# Patient Record
Sex: Male | Born: 1940 | Race: White | Hispanic: No | Marital: Married | Smoking: Former smoker
Health system: Southern US, Community
[De-identification: ages and names within clinical notes are randomized; demographics above are authoritative.]

## PROBLEM LIST (undated history)

## (undated) DIAGNOSIS — I739 Peripheral vascular disease, unspecified: Secondary | ICD-10-CM

## (undated) DIAGNOSIS — I25810 Atherosclerosis of coronary artery bypass graft(s) without angina pectoris: Secondary | ICD-10-CM

## (undated) DIAGNOSIS — E785 Hyperlipidemia, unspecified: Secondary | ICD-10-CM

## (undated) DIAGNOSIS — E119 Type 2 diabetes mellitus without complications: Secondary | ICD-10-CM

## (undated) DIAGNOSIS — I10 Essential (primary) hypertension: Secondary | ICD-10-CM

## (undated) HISTORY — DX: Essential (primary) hypertension: I10

## (undated) HISTORY — PX: CORONARY ARTERY BYPASS GRAFT: SHX141

## (undated) HISTORY — DX: Hyperlipidemia, unspecified: E78.5

## (undated) HISTORY — DX: Peripheral vascular disease, unspecified: I73.9

## (undated) HISTORY — DX: Atherosclerosis of coronary artery bypass graft(s) without angina pectoris: I25.810

## (undated) HISTORY — PX: TOTAL HIP ARTHROPLASTY: SHX124

## (undated) HISTORY — DX: Type 2 diabetes mellitus without complications: E11.9

---

## 2004-07-15 ENCOUNTER — Ambulatory Visit: Payer: Self-pay | Admitting: Cardiovascular Disease

## 2004-07-15 ENCOUNTER — Inpatient Hospital Stay (HOSPITAL_COMMUNITY): Admission: AD | Admit: 2004-07-15 | Discharge: 2004-07-31 | Payer: Self-pay | Admitting: Cardiology

## 2004-07-24 ENCOUNTER — Ambulatory Visit: Payer: Self-pay | Admitting: Cardiovascular Disease

## 2004-08-09 ENCOUNTER — Encounter: Admission: RE | Admit: 2004-08-09 | Discharge: 2004-08-09 | Payer: Self-pay | Admitting: Cardiothoracic Surgery

## 2004-11-20 ENCOUNTER — Ambulatory Visit: Payer: Self-pay | Admitting: Cardiology

## 2005-06-09 ENCOUNTER — Ambulatory Visit: Payer: Self-pay | Admitting: Cardiology

## 2005-07-10 ENCOUNTER — Ambulatory Visit: Payer: Self-pay

## 2005-10-02 ENCOUNTER — Ambulatory Visit: Payer: Self-pay | Admitting: Cardiology

## 2006-04-28 IMAGING — CR DG CHEST 2V
2 series · 2 of 2 positions shown · non-contrast
Comparison: 07/16/2004

CLINICAL DATA: CHF, shortness of breath

CHEST - 2 VIEW:

[view not recorded (1 of 2)]
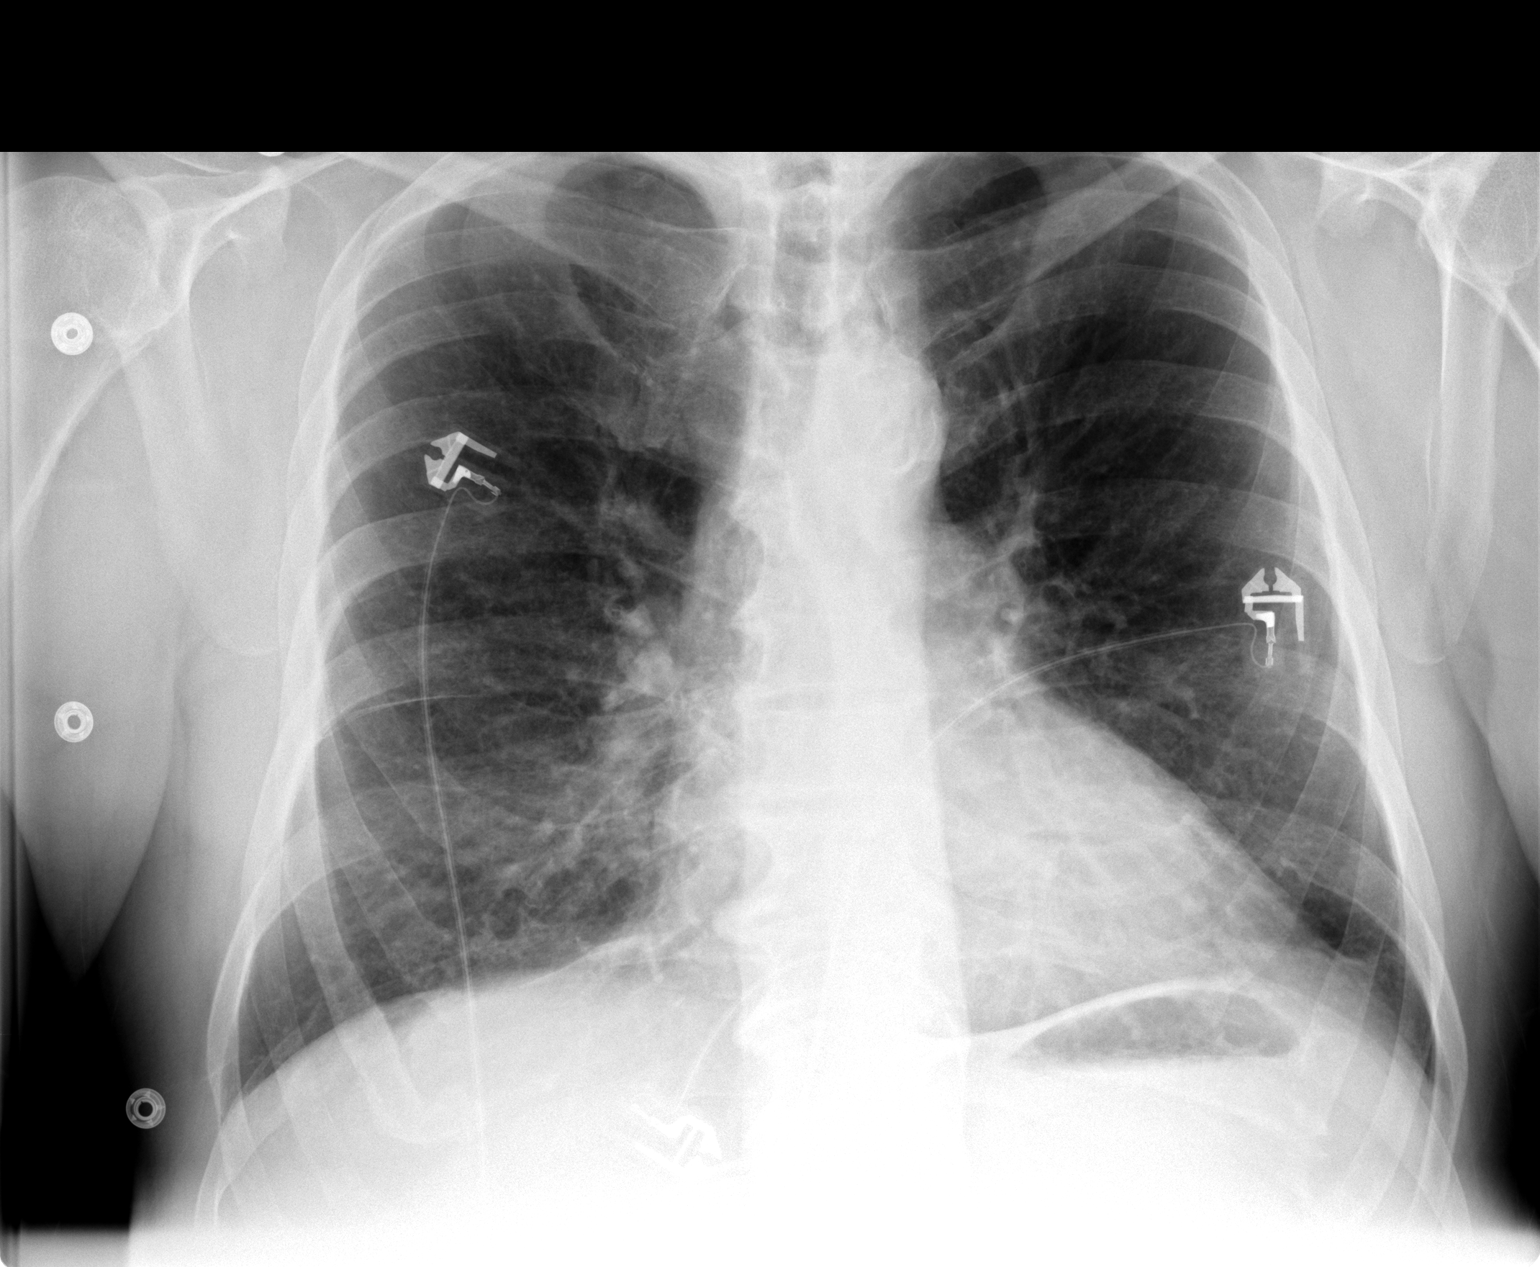

[view not recorded (2 of 2)]
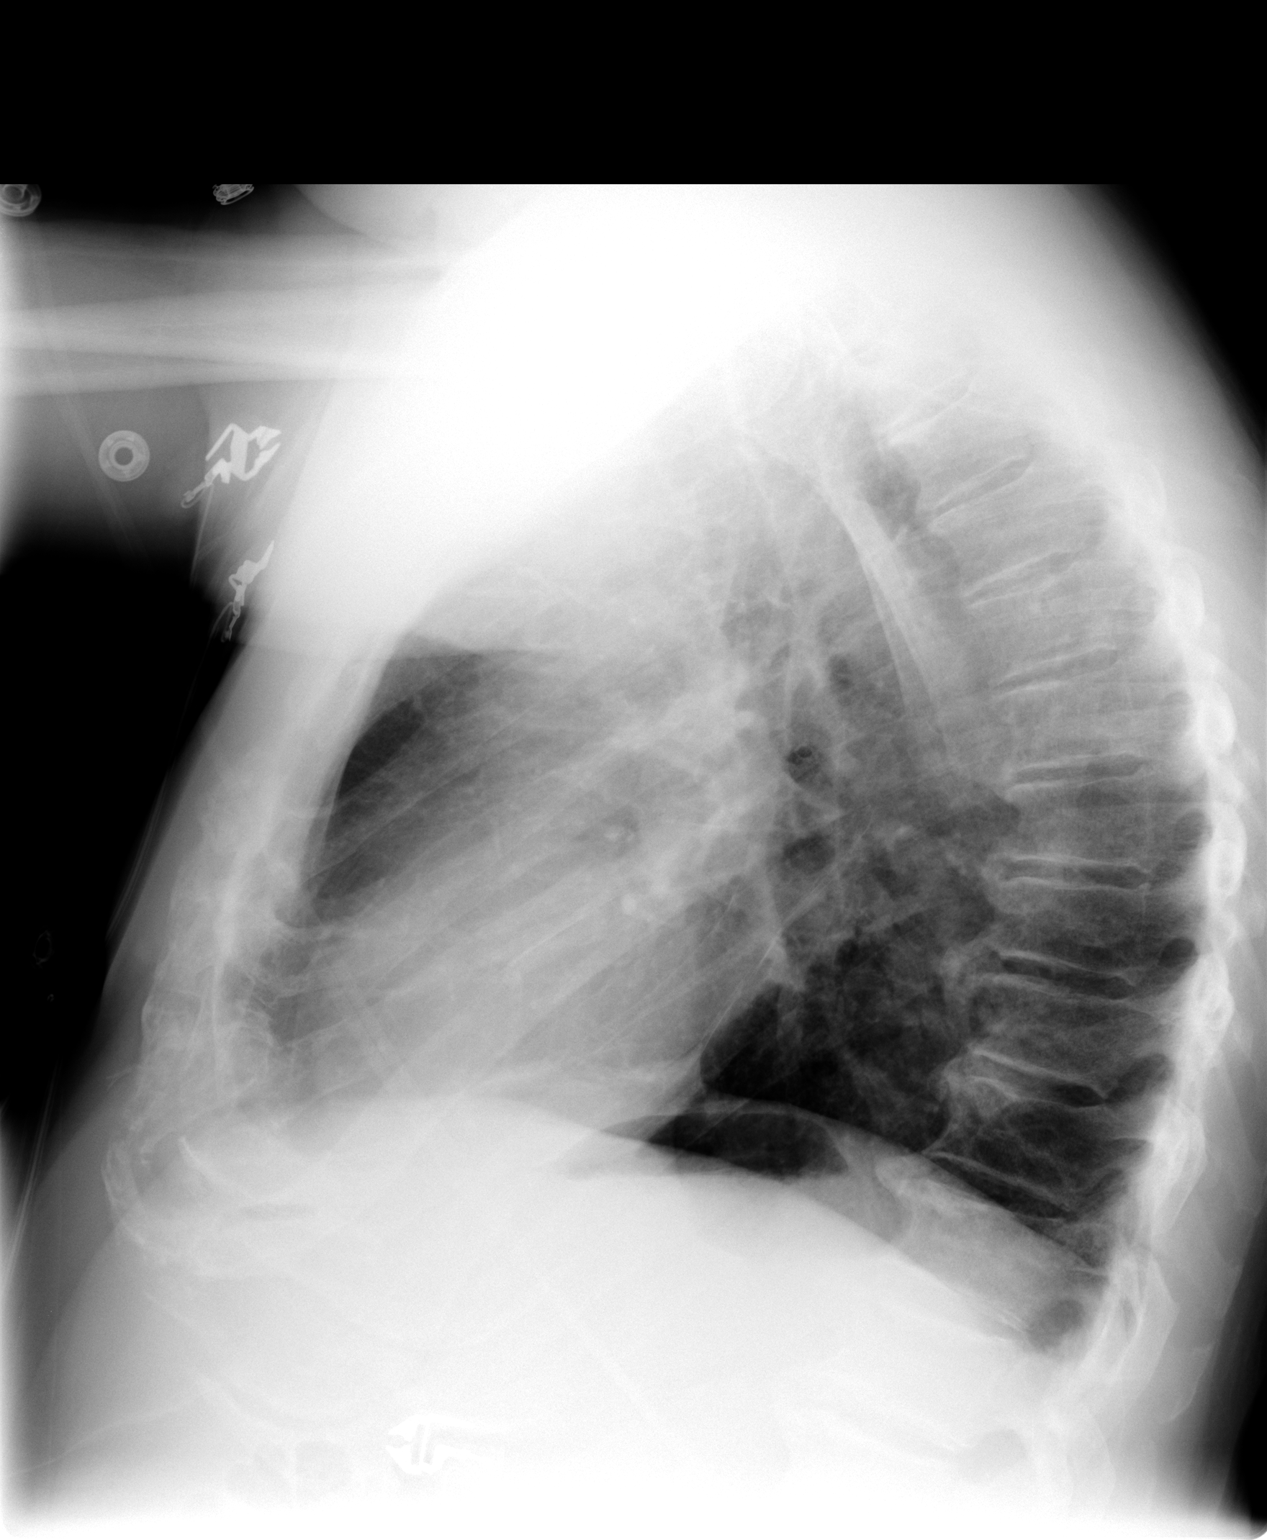

[2 of 2 positions shown; findings below may reference images not displayed]

FINDINGS: Improving CHF pattern with decreasing edema and heart size since
prior study. Mild bibasilar atelectasis. No effusions. Degenerative changes in
the thoracic spine.
IMPRESSION: Improving CHF.

Bibasilar atelectasis.

## 2006-05-03 IMAGING — CR DG CHEST 1V PORT
1 series · 1 of 1 positions shown · non-contrast
Comparison: 07/18/2004.

CLINICAL DATA: Status post CABG.
 PORTABLE SUPINE CHEST:

[view not recorded]
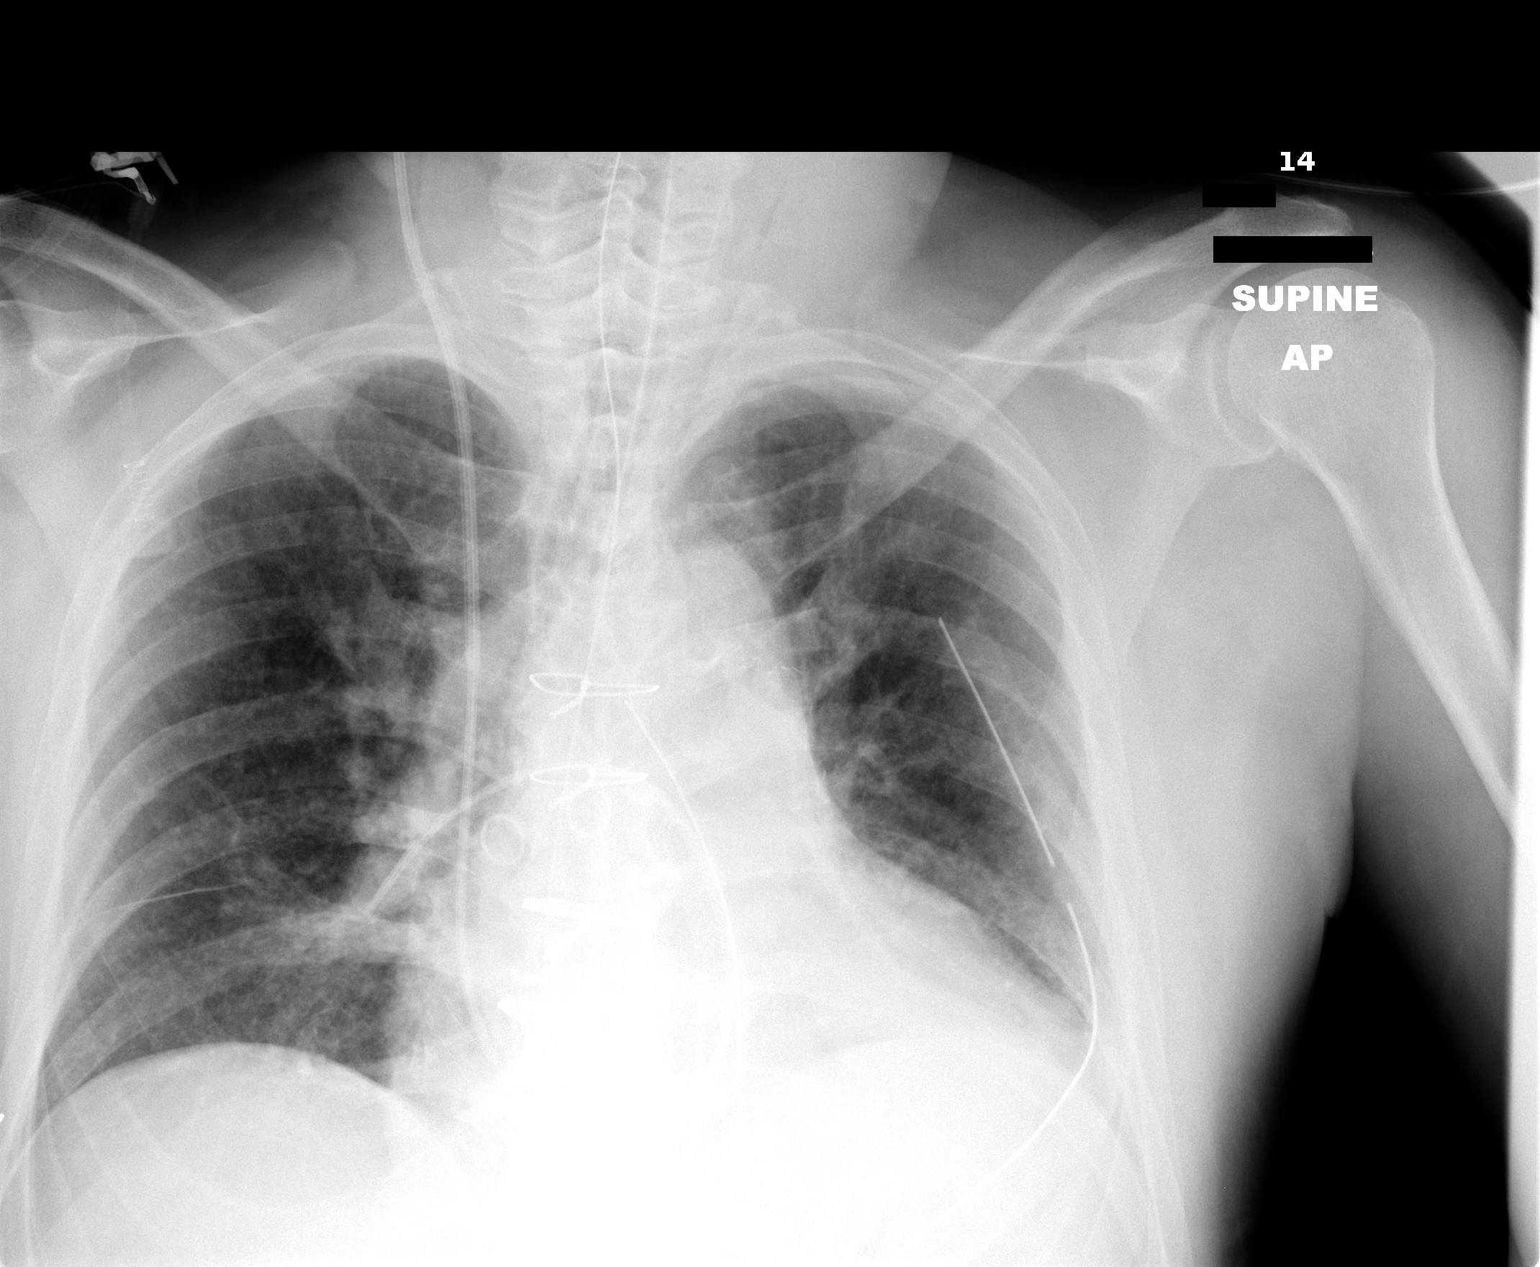

[1 of 1 positions shown; findings below may reference images not displayed]

FINDINGS: Endotracheal tube tip 3 cm above the carina at the level of the aortic knob.  Left-sided chest tubes and mediastinal drains in place without evidence of pneumothorax.  Right Swan-Ganz catheter is in place with tip in the right lower lobe pulmonary artery.  This may need to be retracted by 4.5 cm.  Cardiomegaly and pulmonary vascular prominence.  Floor has been contacted.
IMPRESSION: 1. Swan-Ganz catheter tip right lower lobe pulmonary artery.  This needs to be retracted by 4.5 cm.  
 2. Cardiomegaly and central pulmonary vascular prominence with postoperative changes as noted above.

## 2006-05-04 IMAGING — CR DG CHEST 1V PORT
1 series · 1 of 1 positions shown · non-contrast
Comparison: none

CLINICAL DATA: CHF.  CABG.
 PORTABLE CHEST, ONE VIEW ? 07/24/2004:
 Endotracheal tube and NG tube have been removed.  Mediastinal thoracostomy tubes and Swan-Ganz catheter, cardiomegaly, and evidence of CABG are stable.  Mild pulmonary vascular congestion is unchanged, as well as left basilar atelectasis.

[view not recorded]
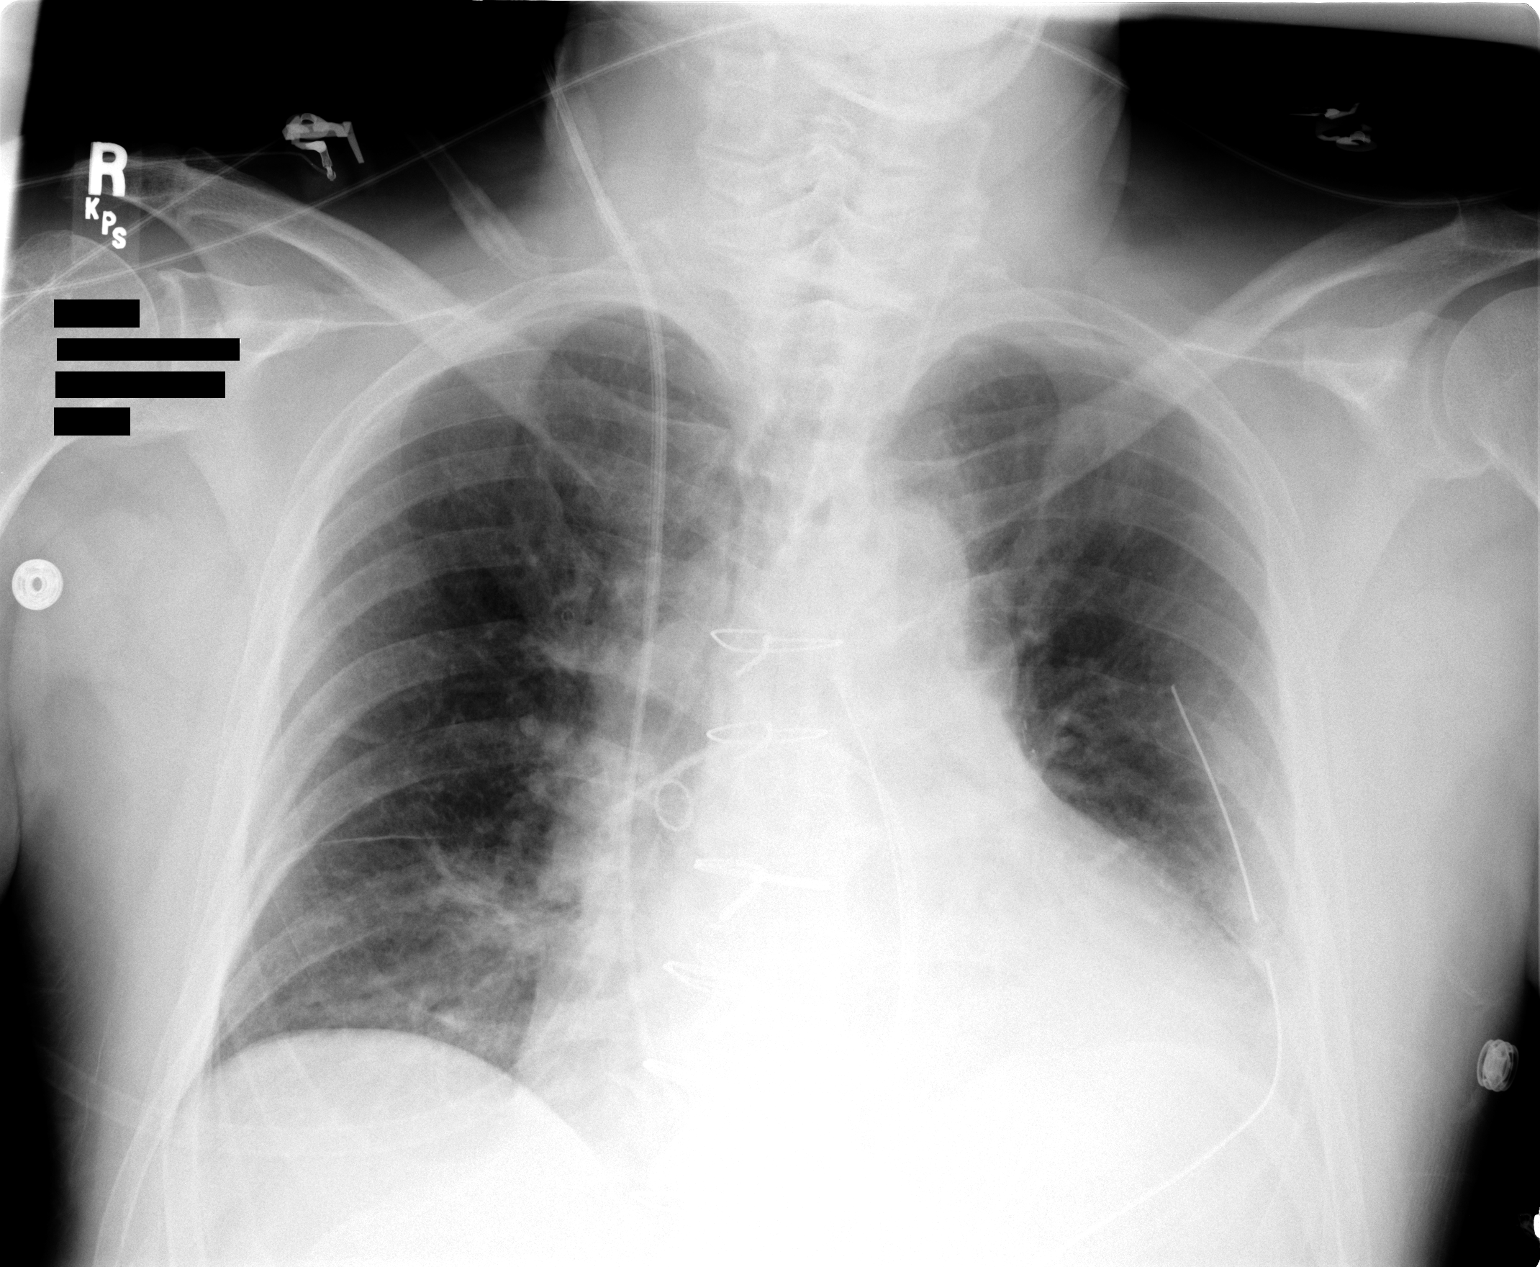

[1 of 1 positions shown; findings below may reference images not displayed]

IMPRESSION: Endotracheal and NG tube removal.  Otherwise stable chest.

## 2007-04-26 ENCOUNTER — Ambulatory Visit: Payer: Self-pay | Admitting: Cardiology

## 2007-05-04 ENCOUNTER — Ambulatory Visit: Payer: Self-pay

## 2007-05-04 ENCOUNTER — Ambulatory Visit: Payer: Self-pay | Admitting: Cardiology

## 2007-05-04 LAB — CONVERTED CEMR LAB
BUN: 14 mg/dL
CO2: 24 meq/L
Calcium: 9.3 mg/dL
Chloride: 99 meq/L
Creatinine, Ser: 1.4 mg/dL
GFR calc Af Amer: 65 mL/min
GFR calc non Af Amer: 54 mL/min
Glucose, Bld: 299 mg/dL — ABNORMAL HIGH
Potassium: 4.7 meq/L
Sodium: 132 meq/L — ABNORMAL LOW

## 2008-01-14 ENCOUNTER — Ambulatory Visit: Payer: Self-pay | Admitting: Cardiology

## 2008-02-16 ENCOUNTER — Ambulatory Visit: Payer: Self-pay | Admitting: Cardiovascular Disease

## 2008-03-06 ENCOUNTER — Encounter: Payer: Self-pay | Admitting: Cardiovascular Disease

## 2008-03-06 ENCOUNTER — Ambulatory Visit: Payer: Self-pay | Admitting: Cardiology

## 2008-03-06 LAB — CONVERTED CEMR LAB
Chloride: 98 meq/L (ref 96–112)
Creatinine, Ser: 1.77 mg/dL — ABNORMAL HIGH (ref 0.40–1.50)
Hemoglobin: 12.3 g/dL — ABNORMAL LOW (ref 13.0–17.0)
INR: 1 (ref 0.0–1.5)
MCHC: 32.9 g/dL (ref 30.0–36.0)
Potassium: 4.3 meq/L (ref 3.5–5.3)
Prothrombin Time: 13.8 s (ref 11.6–15.2)
Sodium: 131 meq/L — ABNORMAL LOW (ref 135–145)

## 2008-03-08 ENCOUNTER — Ambulatory Visit (HOSPITAL_COMMUNITY): Admission: RE | Admit: 2008-03-08 | Discharge: 2008-03-08 | Payer: Self-pay | Admitting: Cardiovascular Disease

## 2008-10-18 DIAGNOSIS — I10 Essential (primary) hypertension: Secondary | ICD-10-CM | POA: Insufficient documentation

## 2008-10-18 DIAGNOSIS — E119 Type 2 diabetes mellitus without complications: Secondary | ICD-10-CM | POA: Insufficient documentation

## 2008-10-18 DIAGNOSIS — E785 Hyperlipidemia, unspecified: Secondary | ICD-10-CM

## 2008-10-18 DIAGNOSIS — I739 Peripheral vascular disease, unspecified: Secondary | ICD-10-CM | POA: Insufficient documentation

## 2008-10-18 DIAGNOSIS — I25810 Atherosclerosis of coronary artery bypass graft(s) without angina pectoris: Secondary | ICD-10-CM

## 2009-01-23 ENCOUNTER — Ambulatory Visit: Payer: Self-pay

## 2009-03-22 ENCOUNTER — Ambulatory Visit: Payer: Self-pay | Admitting: Vascular Surgery

## 2010-06-02 ENCOUNTER — Encounter: Payer: Self-pay | Admitting: Cardiothoracic Surgery

## 2010-06-03 ENCOUNTER — Encounter: Payer: Self-pay | Admitting: Family Medicine

## 2010-06-09 LAB — CONVERTED CEMR LAB
AST: 25 units/L (ref 0–37)
Albumin: 4.1 g/dL (ref 3.5–5.2)
Alkaline Phosphatase: 26 units/L — ABNORMAL LOW (ref 39–117)
Direct LDL: 64.8 mg/dL
Total Bilirubin: 0.9 mg/dL (ref 0.3–1.2)
Total CHOL/HDL Ratio: 5.4
Triglycerides: 266 mg/dL (ref 0–149)
VLDL: 53 mg/dL — ABNORMAL HIGH (ref 0–40)

## 2010-09-24 NOTE — Assessment & Plan Note (Signed)
[facility]                            CARDIOLOGY OFFICE NOTE   KHALIL, SZCZEPANIK                         MRN:          [id number]  DATE:01/14/2008                            DOB:          [birth year]    Mr. Struss is a very pleasant 70 year old male with past medical history  of coronary artery disease, status post coronary artery bypass graft,  peripheral vascular disease, cerebrovascular disease, hypertension, and  hyperlipidemia.  Since I last saw him, he denies any dyspnea, chest  pain, palpitations, or syncope.  Note, he did have coronary artery  bypass graft in March 2006 with LIMA to the LAD, saphenous vein graft to  the diagonal, saphenous vein graft to the obtuse marginal, and saphenous  vein graft to the distal right coronary artery.  He does state that his  claudication has worsened.  He complains of bilateral lower extremity  pain with ambulation.  He can now only walk a quarter of a block without  having to stop and sit down.  It is limiting what he is able to do.  His  last ABIs were performed on May 04, 2007.  They were in the  moderate range.   His medications include Avalide 300/25 mg p.o. daily, omeprazole 40 mg  p.o. daily, fenofibrate 134 mg p.o. daily, glimepiride 4 mg p.o. daily,  clonidine 0.1 mg p.o. nightly, aspirin 325 mg p.o. daily, Zocor 20 mg  p.o. daily, Pletal 100 mg p.o. b.i.d., flaxseed oil, metformin 500 mg  p.o. b.i.d., metoprolol 25 mg p.o. b.i.d.   PHYSICAL EXAM:  VITAL SIGNS:  Today shows a blood pressure of 152/73 and  his pulse is 68.  Weight is 203 pounds.  HEENT:  Normal.  NECK:  Supple.  CHEST:  Clear.  CARDIOVASCULAR:  Regular rate and rhythm.  ABDOMEN:  No tenderness.  EXTREMITIES:  No edema.   His electrocardiogram shows a sinus rhythm at a rate of 62.  There is a  first-degree AV block.  There is inferolateral T-wave inversion.   DIAGNOSES:  1. Coronary artery disease, status post coronary  artery bypass graft -      the patient has had no chest pain or shortness of breath.  We will      continue his medical therapy to include his aspirin, beta-blocker,      angiotensin-converting enzyme inhibitor, and statin.  2. Claudication/peripheral vascular disease - his claudication appears      to be worsening.  It is now limiting what he was able to do.  He      will continue on his aspirin.  I will arrange for him to see Dr.      Myrene Galas for further evaluation.  He may need an angiogram.  3. Tobacco abuse, now resolved.  4. Hypertension - his blood pressure is elevated today.  I have added      felodipine 5 mg p.o. daily to his medical regimen.  He will track      this at home, so we can increase as needed.  I will check BMET  given his angiotensin-converting enzyme inhibitor use.  5. Cerebrovascular disease - he needs followup carotid Dopplers in      December.  6. Hyperlipidemia - he will continue on statin and Tricor and we will      check lipids and liver and adjust as indicated.  7. Diabetes mellitus.   He will see [place] back in 6 months.     Madolyn Frieze Jens Som, MD, [facility]  Electronically Signed    BSC/MedQ  DD: 01/14/2008  DT: 01/15/2008  Job #: [id number]

## 2010-09-24 NOTE — Consult Note (Signed)
NEW PATIENT CONSULTATION   Harry Hebert, Harry Hebert  DOB:  12/16/1940                                       03/22/2009  CHART#:[id number]   I saw the patient in the office today in consultation concerning his  carotid disease.  This is a pleasant 70 year old gentleman who was found  to have a carotid bruit by Dr. Samuel Germany.  This prompted a carotid duplex  scan, which showed evidence of bilateral carotid disease.  He was sent  for vascular consultation.  Of note, the patient is left-handed.  He  denies any previous history of stroke, TIAs, expressive or receptive  aphasia, or amaurosis fugax.   PAST MEDICAL HISTORY:  Significant for adult-onset diabetes which has  been stable on his current medications.  He does not require insulin.  In addition, he has hypertension, which is also well-controlled on his  current medications.  He also has hypercholesterolemia and a history of  coronary artery disease.  He underwent coronary revascularization by Dr.  Donata Clay approximately 4 years ago after a myocardial infarction.  He  denies any history of congestive heart failure or history of COPD.   In addition, the patient has a history of chronic low back pain.  He has  some mild renal insufficiency based on blood work in Dr. Martha Clan office  previously.   FAMILY HISTORY:  He is unaware of any history of premature  cardiovascular disease.   SOCIAL HISTORY:  He is married and he has 8 children.  He used to smoke  1-1/2 to 2 packs per day of cigarettes but quit 4 years ago after his  heart attack.  He does not use alcohol on a regular basis.   MEDICATIONS:  Documented on the medical history form in his chart.   REVIEW OF SYSTEMS:  VASCULAR:  He does describe some bilateral lower  extremity claudication with no history of rest pain or nonhealing  ulcers.  He has had no history of DVT or phlebitis.  NEUROLOGIC:  He has occasional headaches.  ORTHOPEDIC:  He has occasional arthritis and  joint pain.  PSYCHIATRIC:  He has had some problems with nervousness in the past.  General, cardiac, pulmonary, GI, GU, ENT, hematologic review of systems  are unremarkable and are documented on the medical history form in his  chart.   PHYSICAL EXAMINATION:  This is a pleasant 70 year old gentleman who  appears his stated age.  Blood pressure is 146/65 on the right, 140/64  on the left, heart rate is 81, temperature 97.8.  HEENT:  Extraocular  motions are intact.  Conjunctivae are normal.  There is no facial  asymmetry.  Neck is supple.  There is no JVD, and there is no cervical  lymphadenopathy.  Lungs are clear bilaterally to auscultation without  rales, rhonchi or wheezing.  Cardiovascular exam:  He has a left carotid  bruit.  Cardiac exam:  He has a regular rate and rhythm without murmur  appreciated.  He has no significant peripheral edema.  He has palpable  radial pulses and palpable femoral pulses bilaterally.  I cannot palpate  pedal pulses, although both feet are adequately perfused without  ischemic ulcers.  Abdomen is soft and nontender with normal-pitched  bowel sounds.  I cannot appreciate an aneurysm.  Musculoskeletal exam:  There are no major deformities or cyanosis.  Neurologic exam:  He has no  focal weakness or paresthesias.  Skin:  He has no rashes or ulcers.   We did obtain a carotid duplex scan in our office, which I independently  interpreted, and this shows bilateral 60%-79% carotid stenoses in the  mid range.  End-diastolic velocity on the right is only 20 cm/sec. and  on the left 59 cm/sec., so the stenoses are clearly less than 80%.  Both  vertebral arteries are patent with normally-directed flow.   I have explained we generally would not consider carotid endarterectomy  in an asymptomatic patient unless the stenosis progressed to greater  than 80% or he developed new neurologic symptoms.  I have recommend a  follow-up duplex scan in 6 months and I will see  him back at that time.  He knows to call sooner if he has any new neurologic symptoms.  In  addition, he knows to continue taking his aspirin.   Di Kindle. Edilia Bo, M.D.  Electronically Signed   CSD/MEDQ  D:  03/22/2009  T:  03/23/2009  Job:  [id number]   cc:   Renae Fickle, MD

## 2010-09-24 NOTE — Assessment & Plan Note (Signed)
[facility]                            CARDIOLOGY OFFICE NOTE   SLOANE, JUNKIN                         MRN:          [id number]  DATE:04/26/2007                            DOB:          Oct 10, 1940    The patient is a 70 year old gentleman followed previously by Salvadore Farber, M.D. who returns for follow-up today.  The patient's cardiac  history dates back to March of 2006.  At that time he had a  catheterization following a subendocardial myocardial infarction.  His  ejection fraction was 40-45% and there was severe three-vessel disease.  He ultimately on July 23, 2004, underwent coronary artery bypass graft  with LIMA to LAD, saphenous vein graft to the diagonal, saphenous vein  graft to the obtuse marginal, and saphenous vein graft to the distal  right coronary artery.  Dr. Samule Ohm has also followed the patient for  peripheral vascular disease.  He also has a history of cerebrovascular  disease.  Since he was last seen he denies any dyspnea, chest pain,  palpitations, or syncope.  He does have bilateral claudication, but is  able to walk approximately 100 yards without any symptoms.  He was  placed on Pletal for this in the past.   CURRENT MEDICATIONS:  1. Aspirin 325 mg p.o. daily.  2. Felodipine 10 mg p.o. daily.  3. Zocor 20 mg p.o. daily.  4. Pletal 100 mg p.o. b.i.d.  5. Flax seed oil.  6. Metformin 500 mg p.o. b.i.d.  7. Prevacid 30 mg p.o. daily.  8. Benazepril 20 mg p.o. daily.  9. Metoprolol 25 mg p.o. b.i.d.  10.TriCor 145 mg p.o. daily.   PHYSICAL EXAMINATION:  VITAL SIGNS:  Blood pressure 171/78, pulse 63, he  weighs 200 pounds.  HEENT:  Normal.  NECK:  Supple with bilateral bruits.  CHEST:  Clear.  HEART:  Regular rate and rhythm.  ABDOMEN:  No tenderness.  EXTREMITIES:  No edema.   EKG shows sinus rhythm at a rate of 59.  There is a first degree AV  block.  There is evidence of a prior inferior infarct.   DIAGNOSIS:  1. Coronary artery disease status post coronary artery bypass graft -      he has had no chest pain or shortness of breath and we will      continue his medical therapy including his aspirin, statin, ACE      inhibitor, and beta blocker.  2. Peripheral vascular disease - he is continuing to have      claudication.  We will plan to repeat his ankle brachial indices.      We will continue with his aspirin and Pletal as well as exercise.  3. Tobacco abuse - now resolved.  4. Cerebrovascular disease - we will plan to proceed with follow-up      carotid Dopplers.  5. Hypertension - his blood pressure is elevated today and I will      increase his benazepril to 40 mg p.o. daily.  We will check a BMET      in one week to  follow his potassium and renal function.  6. Hyperlipidemia - he will continue on his statin and TriCor and this      is being followed in [place].  7. Diabetes mellitus - this is also being followed in [place].   We will see him back in approximately nine months.     Madolyn Frieze Jens Som, MD, [facility]  Electronically Signed    BSC/MedQ  DD: 04/26/2007  DT: 04/27/2007  Job #: [id number]   cc:   Heide Guile, MD

## 2010-09-24 NOTE — Assessment & Plan Note (Signed)
[facility] OFFICE NOTE   Harry, Hebert                         MRN:          [id number]  DATE:02/16/2008                            DOB:          06/08/1940    PRIMARY CARDIOLOGIST:  Madolyn Frieze. Jens Som, MD, [facility]   HISTORY OF PRESENT ILLNESS:  [name] is a pleasant 70 year old  Caucasian male with a past medical history significant for hypertension,  hyperlipidemia, tobacco abuse, known coronary artery disease status post  four-vessel CABG, diabetes mellitus, and known peripheral vascular  disease who presents to the Peripheral Vascular Clinic in [place]  here today for further evaluation of bilateral lower extremity pain with  ambulation.  The patient tells me that he has had pain in his lower  extremities when he ambulates for several years.  Upon reviewing, his  records it appears that he has been followed with noninvasive arterial  Doppler studies.  The Dopplers in 2007, were suggestive of severe  occlusive disease in the distal right common femoral artery with  moderate stenosis in the proximal one-third of the right superficial  femoral artery and moderate stenosis in the mid superficial femoral  artery on the right.  The left superficial femoral artery appeared to be  occluded at the ostium with reconstitution via collaterals.  The patient  had been managed with a walking program along with Pletal.  Followup  noninvasive arterial Dopplers performed in December 2008, showed that  there was monophasic flow in the right anterior tibial artery and right  posterior tibial artery.  On the left, there was monophasic flow in the  posterior tibial artery and biphasic flow in the anterior tibial artery.  The ankle brachial index on the right was 0.67 and on the left was 0.60.  It was felt that the noninvasive exam was stable from the exam 18 months  prior.  The patient is followed by Dr. Olga Millers for  his coronary  artery disease, hypertension, and hyperlipidemia.  He was referred here  today for further evaluation of his claudication.   He tells me that he can now only walk about 100 feet before he has the  onset of severe bilateral cramping in his calves.  This starts out with  a stinging pain that progresses to a burning pain and occasionally a  tingling and numbness in the lower extremities.  As soon as he sits down  and rest, the pain goes away.  He is occasionally awakened at night with  pain in both of his legs.  Tells me that his left hip has been painful  for many years secondary to a hip fracture that was sustained after  falling off a ladder and falling approximately 20 feet.  He denies  having any discoloration over his lower extremities or any open  ulcerations.  He has had no prior invasive procedures performed on his  lower extremities.  He was a heavy smoker for many years but has not  smoked in the last 3 years since his CABG.   PAST MEDICAL HISTORY:  1. Hypertension.  2. Hyperlipidemia.  3. Tobacco abuse, having smoked 2 packs per day for 50 years but has      not smoked in the last 3 years.  4. Coronary artery disease status post four-vessel CABG in March 2006.  5. Diabetes mellitus.  6. Known peripheral vascular disease as described above.   PAST SURGICAL HISTORY:  1. Four-vessel CABG.  2. Hip surgery after trauma to the left hip.   ALLERGIES:  PENICILLIN.   CURRENT MEDICATIONS:  1. Aspirin 325 mg once daily.  2. Pletal 100 mg twice daily.  3. Omeprazole 40 mg once daily.  4. Metoprolol 25 mg twice daily.  5. Lisinopril/hydrochlorothiazide 20/25 mg once daily.  6. Zocor 20 mg once daily.  7. Metformin 500 mg twice daily.  8. Amlodipine 10 mg once daily.  9. Glimepiride 4 mg once daily.  10.Fenofibrate 200 mg once daily.  11.Clonidine 0.1 mg twice daily.   SOCIAL HISTORY:  The patient has a history of 100-pack-year-history of  tobacco abuse but has  not smoked in the last 3 years.  He also has a  history of heavy alcohol use but no longer uses alcohol.  He denies use  of illicit drugs.  He is married and has 8 children.   FAMILY HISTORY:  The patient's father died of COPD, and his mother died  from unknown causes.  He has no family history of coronary artery  disease.   REVIEW OF SYSTEMS:  As stated in the history of present illness, and is  otherwise negative.  The patient denies any chest pain, shortness of  breath, palpitations, dizziness, near syncope, syncope, orthopnea, PND,  or lower extremity edema.  As noted above, his only complaint today is  that of bilateral lower extremity pain with ambulation that resolves  with rest.   PHYSICAL EXAMINATION:  GENERAL:  He is a pleasant middle-aged Caucasian  male in no acute distress.  VITAL SIGNS:  Blood pressure 126/58, pulse 60 and regular, respirations  12 and unlabored.  Weight 199 pounds.   FOCUSED PERIPHERAL VASCULAR EXAMINATION:  The right lower extremity has  no evidence of edema, discoloration, or open ulcerations.  There are no  varicosities noted.  There is no edema noted.  The dorsalis pedis and  posterior tibial pulses are faint by palpation, but by Doppler are  biphasic.  The left lower extremity has no evidence of varicosities,  edema, discoloration, or open ulcerations.  I am unable to palpate the  dorsalis pedis or posterior tibial pulses on the left lower extremity.  Doppler reveals monophasic flow in the dorsalis pedis and posterior  tibial arteries.  Sensation is intact over both lower extremities in  both feet.   DIAGNOSTIC STUDIES:  1. Lower extremity Doppler examination with pressures performed in      December 2008, shows monophasic flow in the right anterior tibial      artery and right posterior tibial artery.  In the left, there is      monophasic flow in the posterior tibial artery and biphasic flow in      the anterior tibial artery.  ABI on the  right is 0.67 and on the      left is 0.60.  2. Carotid Dopplers from May 04, 2007, show mild carotid disease      on the right with 0-39% stenosis in the right internal carotid      artery.  There is mild-to-moderate carotid disease on the left with  40-59% stenosis in the left internal carotid artery.   ASSESSMENT/PLAN:  This is a pleasant 70 year old Caucasian male with  known coronary artery disease, known peripheral vascular disease,  hypertension, hyperlipidemia, diabetes mellitus, and history of tobacco  abuse who presents to the Peripheral Vascular Clinic with complaints of  bilateral lower extremity claudication.  The patient by prior  noninvasive studies has severe occlusive disease of both lower  extremities.  I think at this time, his symptoms have accelerated to the  point that medications are no longer effective.  I would like to perform  an abdominal aortogram with bilateral lower extremity runoff.  This will  be performed at [facility] on March 08, 2008.  We will assess  his lower extremity circulation at that time, and decide if a  percutaneous revascularization would be possible or if he should be  referred for surgical revascularization.  The patient is agreeable to  have this procedure performed.  We will obtain appropriate laboratory  studies prior to his lower extremity runoff.  We will plan on seeing him  back in the office here several weeks after his testing is performed.     Verne Carrow, MD  Electronically Signed    CM/MedQ  DD: 02/16/2008  DT: 02/17/2008  Job #: [id number]   cc:   Madolyn Frieze. Jens Som, MD, [facility]

## 2010-09-24 NOTE — Procedures (Signed)
CAROTID DUPLEX EXAM   INDICATION:  Repeat right carotid.   HISTORY:  Diabetes:  Unknown.  Cardiac:  No.  Hypertension:  Yes.  Smoking:  No.  Previous Surgery:  No.  CV History:  Amaurosis Fugax No, Paresthesias No, Hemiparesis No                                       RIGHT             LEFT  Brachial systolic pressure:         141               140  Brachial Doppler waveforms:         Triphasic         Triphasic  Vertebral direction of flow:        Antegrade         Antegrade  DUPLEX VELOCITIES (cm/sec)  CCA peak systolic                   120               97  ECA peak systolic                   273               581  ICA peak systolic                   225               194  ICA end diastolic                   20                59  PLAQUE MORPHOLOGY:                  Mixed             Mixed  PLAQUE AMOUNT:                      Moderate to severe                  Moderate to severe  PLAQUE LOCATION:                    Bifurcation, ICA, ECA               Bifurcation, ICA, ECA   IMPRESSION:  1. 60%-79% stenosis of the bilateral internal carotid arteries.  2. Bilateral external carotid artery stenosis.         ___________________________________________  Di Kindle. Edilia Bo, M.D.   CJ/MEDQ  D:  03/22/2009  T:  03/22/2009  Job:  [id number]

## 2010-09-27 NOTE — Op Note (Signed)
NAME:  Harry Hebert, Harry Hebert                ACCOUNT NO.:  [id number]   MEDICAL RECORD NO.:  [id number]          PATIENT TYPE:  INP   LOCATION:  2301                         FACILITY:  [facility]   PHYSICIAN:  Kathlee Nations Trigt III, M.D.DATE OF BIRTH:  07-22-1940   DATE OF PROCEDURE:  07/23/2004  DATE OF DISCHARGE:                                 OPERATIVE REPORT   OPERATION:  Coronary artery bypass grafting x4 (left internal mammary artery  to LAD, saphenous vein graft to diagonal, saphenous vein graft to obtuse  marginal, saphenous vein graft to distal right coronary artery).   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   SURGEON:  Kerin Perna, M.D.   ASSISTANT:  Rowe Clack, P.A.-C.   ANESTHESIA:  General.   INDICATIONS FOR PROCEDURE:  The patient is a 70 year old white male smoker  who presented with the acute onset chest pain and CHF and ruled in with  positive enzymes. He was transferred to [facility] where cardiac  catheterization demonstrated severe three-vessel coronary disease with  chronic occlusion of the right coronary and high-grade stenosis of the LAD  diagonal and circumflex with a left main stenosis. His ejection fraction was  40%. He was felt to be candidate for surgical revascularization.   Prior to surgery, I examined the patient in his hospital room on several  occasions and reviewed the results of cardiac catheterization with the  patient. I discussed with him the indications and expected benefits of  coronary bypass surgery for treatment of his coronary disease. I reviewed  the major aspects of the planned procedure including the choice of conduit  for grafts, the location of the surgical incisions, use of general  anesthesia and cardiopulmonary bypass, and the expected postoperative  hospital recovery. I discussed with the patient risks to him of  coronary  artery bypass surgery including risks of MI, CVA, bleeding, blood  transfusion requirement, infection, and death. I discussed with him the  alternatives to surgical therapy and the expected outcome of those  alternative therapies. After reviewing all these issues the patient  demonstrated understanding and he agreed to proceed with operation under  what I felt was an informed consent.   OPERATIVE FINDINGS:  The patient's vein was harvested from both legs. The  right leg vein was harvested endoscopically but was small and had only one  segment length that was adequate for use as the diagonal graft. The left leg  vein was harvested endoscopically and was of adequate quality and caliber.  The mammary artery was a good conduit. The patient had a dilated enlarged  heart. The coronaries were diffusely diseased. The patient had evidence of  an old inferior wall MI. He had severe COPD. His ascending aorta was  foreshortened. The saphenous vein graft to the diagonal was sewn at its  proximal anastomosis to the saphenous vein graft to the OM due to its small  size of the diagonal vein graft and the thickened wall of the aorta. The  patient  was given aprotinin protocol for this operation to reduce the risk  of perioperative bleeding as well as the anti-inflammatory effect of this  drug which would theoretically help reduce the risk of a acute lung injury  or systemic inflammatory response following cardiopulmonary bypass. His  creatinine preoperatively was normal.   PROCEDURE:  The patient was brought to the operating room and placed supine  on the operating table where general anesthesia was induced under invasive  hemodynamic monitoring. The chest, abdomen and legs were prepped with  Betadine and draped as a sterile field. A sternal incision was made as the  saphenous vein was harvested endoscopically from both upper legs. The left  internal mammary artery was harvested as a pedicle  graft from its origin at  the subclavian vessels and was good vessel with excellent flow. Heparin was  administered and ACT was documented as being therapeutic for the aprotinin  protocol. Sternal retractor was placed. The pericardium was opened and  suspended. Pursestrings were placed in the ascending aorta and right atrium  and the patient was cannulated and placed on bypass. The coronaries were  identified for grafting. Cardioplegia catheters were placed both antegrade  aortic and retrograde coronary sinus cardioplegia. The patient was cooled to  30 degrees. The vein grafts and mammary artery were prepared for the distal  anastomoses. A crossclamp was applied and 800 mL of cold blood cardioplegia  was delivered in split doses between the antegrade aortic and retrograde  coronary sinus catheters. There was good cardioplegic arrest with septal  temperature dropping less than 12 degrees. Topical iced saline was used to  augment myocardial preservation and a pericardial insulator pad was used to  protect left phrenic nerve.   The distal coronary anastomoses were performed. First distal anastomosis was  the distal right. This had total proximal occlusion. Reverse saphenous vein  was sewn end-to-side to this 1.5 mm vessel with running 7-0 Prolene with  good flow through graft. The second distal anastomosis was the diagonal.  This is a 1.5 mm vessel with proximal 80% stenosis. A reverse saphenous vein  of small caliber was sewn end-to-side with running 7-0 Prolene and there was  good flow through graft. Cardioplegia was redosed. The third distal  anastomosis was to the OM1. This was high underneath the atrial appendage  and was intramyocardial. This is a 1.7 mm vessel. Reverse saphenous vein was  sewn end-to-side with running 7-0 Prolene and there was good flow through  graft. Cardioplegia was redosed. The fourth distal anastomosis was to the distal third LAD. The LAD had diffuse calcified  disease. Left IMA pedicle  was brought through an opening created in the left lateral pericardium and  was brought down onto the LAD and sewn end-to-side with running 8-0 Prolene.  There is excellent flow through the anastomosis after briefly releasing the  pedicle bulldog on the mammary pedicle. The bulldog was reapplied and the  pedicle was secured to epicardium with two 6-0 Prolene sutures. Cardioplegia  was redosed.   While the crossclamp was still in place two proximal vein anastomoses were  placed in the ascending aorta using a 4.0 mm punch and running 6-0 Prolene.  Prior to tying down the last proximal anastomosis, the air was flushed from  the coronaries and from the left side of the heart using a dose of  retrograde warm blood cardioplegia as well as usual de-airing maneuvers on  bypass. The crossclamp was then removed. The vein grafts were de-aired with  a 27 gauge needle and the heart resumed a spontaneous rhythm. At this point  the proximal anastomosis of the diagonal vein graft was sewn to the proximal  segment of the circumflex vein due to the small size of the diagonal vein  graft. This resulted in a nice end-to-side anastomosis with excellent flow  through each graft. The proximal and distal anastomoses were checked and  found to be hemostatic. The patient was rewarmed to 37 degrees. The  cardioplegia cannulas were removed. The lungs re-expanded. The ventilator  was resumed. The patient was then weaned from bypass without difficulty with  stable blood pressure and cardiac output. Protamine was administered without  adverse reaction. The cannulas were removed. The mediastinum was irrigated  with warm antibiotic irrigation. Leg incision was irrigated and closed in a  standard fashion. The pericardium was reapproximated superiorly. Two  mediastinal and left pleural chest tube were placed and brought out through  separate incisions. The sternum was closed with  interrupted  steel wire. The pectoralis fascia was closed with a running #1  Vicryl. Subcutaneous and skin layers were closed with a running Vicryl and  sterile dressings were applied. Total bypass time was 130 minutes with  crossclamp time of 80 minutes.      PV/MEDQ  D:  07/23/2004  T:  07/23/2004  Job:  [id number]   cc:   [facility]   CVTS Office
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
## 2010-09-27 NOTE — Cardiovascular Report (Signed)
NAME:  Harry Hebert, Harry Hebert                ACCOUNT NO.:  [id number]   MEDICAL RECORD NO.:  [id number]          PATIENT TYPE:  INP   LOCATION:  4728                         FACILITY:  [facility]   PHYSICIAN:  Carole Binning, M.D. LHCDATE OF BIRTH:  04/17/1941   DATE OF PROCEDURE:  07/16/2004  DATE OF DISCHARGE:                              CARDIAC CATHETERIZATION   PROCEDURE PERFORMED:  Right left heart catheterization with coronary  angiography, left ventriculography and abdominal aortography.   INDICATIONS:  Harry Hebert is a 70 year old male who presented to [facility] with shortness of breath. He was found to be in congestive heart  failure. He also had elevated troponin levels consistent with non-ST-segment  elevation myocardial infarction. He was transferred to [facility] and referred for right and left heart catheterization.   PROCEDURE NOTE:  A 7-French sheath was placed in the right femoral vein. A 6-  French sheath in the right femoral artery. Right heart catheterization was  performed with a Swan-Ganz catheter. Left ventricular pressures and left  ventriculography was performed with an angled pigtail catheter. Abdominal  aortography was performed with an angled pigtail catheter. Coronary  angiography was performed with standard Judkins 6-French catheters. Contrast  was Omnipaque. There were no complications.   RESULTS:  Hemodynamics right atrial mean pressure 12, right ventricular  pressure 43/14, pulmonary artery pressure 43/23. Pulmonary capillary wedge  pressure A: 28, V: 28, mean 22. Left ventricular pressure 168/37. Aortic  pressure 160/72. There is no significant gradient across the aortic valve.  There is possibly a mild gradient across the mitral valve on simultaneous  measurements of pulmonary capillary wedge pressure and left ventricular and  end-diastolic pressure.   Cardiac output by the thermal dilution method is 4.4. Cardiac index 2.2.  Cardiac output by the Fick method is 5.7. Cardiac index 2.8.   LEFT VENTRICULOGRAM:  There is moderate to severe akinesis of the inferior  wall with hypokinesis of anterolateral wall. Ejection fraction is estimated  40-45%. There is no mitral regurgitation.   ABDOMINAL AORTOGRAM:  Reveals patent renal arteries. There is moderate  diffuse atherosclerotic disease of the abdominal aorta and iliac arteries,  greater on the left in the right with approximately 70-80% stenosis in the  left iliac artery.   CORONARY ANGIOGRAPHY:  1.  Left main has an eccentric 60-70% stenosis at its ostium.  2.  Left anterior descending artery has a 70% stenosis at its ostium. In the      proximal LAD, there is a diffuse 30% stenosis. The mid-LAD has a tubular      80% stenosis. The LAD gives rise to a large bifurcating first diagonal      branch.  3.  The first diagonal has an 80% stenosis at its ostium. In the medial      branch of this diagonal, there is a 60% stenosis and in the lateral      branch of this diagonal there is a 50% stenosis.  4.  Left circumflex gives rise to a large first obtuse marginal and a small  second obtuse marginal branch. There is a 30% stenosis in the first      obtuse marginal branch. There is moderate diffuse disease in the distal      circumflex.  5.  Right coronary artery is a dominant vessel. It is 100% occluded      proximally. There are right-to-right and left-to-right collaterals      filling the distal right coronary which includes a posterior descending      artery and two posterolateral branches.   IMPRESSION:  1.  Moderately elevated right and left heart filling pressures with moderate      pulmonary hypertension.  2.  Moderately decreased left ventricular systolic function secondary to      ischemic heart disease.  3.  Three-vessel coronary disease.  4.  Peripheral vascular disease as described.   PLAN:  The patient will be referred for evaluation for  coronary bypass  surgery.      MWP/MEDQ  D:  07/16/2004  T:  07/17/2004  Job:  [id number]   cc:   Antonietta Breach, M.D.   Heide Guile, MD  [address]  [place]  [place] [address]  Fax: [phone]
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
## 2010-10-31 ENCOUNTER — Encounter: Payer: Self-pay | Admitting: Cardiovascular Disease

## 2011-02-10 LAB — BASIC METABOLIC PANEL
BUN: 24 — ABNORMAL HIGH
CO2: 23
Chloride: 100
GFR calc non Af Amer: 36 — ABNORMAL LOW
Glucose, Bld: 175 — ABNORMAL HIGH
Potassium: 4
Sodium: 131 — ABNORMAL LOW

## 2011-02-10 LAB — POCT I-STAT, CHEM 8
Chloride: 99
Creatinine, Ser: 2.1 — ABNORMAL HIGH
Glucose, Bld: 178 — ABNORMAL HIGH
Hemoglobin: 11.6 — ABNORMAL LOW
Potassium: 4
Sodium: 132 — ABNORMAL LOW

## 2011-02-10 LAB — GLUCOSE, CAPILLARY: Glucose-Capillary: 183 — ABNORMAL HIGH

## 2014-07-27 ENCOUNTER — Telehealth (HOSPITAL_COMMUNITY): Payer: Self-pay | Admitting: Cardiology

## 2014-07-27 NOTE — Telephone Encounter (Signed)
Received records from [facility] for appointment with Dr Jens Somrenshaw on 08/30/14 [facility]([place] office)  [facility] for Dr Ludwig Clarksrenshaw's schedule on 08/30/14.  lp

## 2014-08-30 ENCOUNTER — Ambulatory Visit: Payer: Self-pay | Admitting: Cardiology

## 2014-09-27 ENCOUNTER — Ambulatory Visit: Payer: Self-pay | Admitting: Cardiology

## 2014-11-17 ENCOUNTER — Encounter: Payer: Self-pay | Admitting: *Deleted

## 2014-11-22 ENCOUNTER — Ambulatory Visit: Payer: Self-pay | Admitting: Cardiology

## 2014-11-23 ENCOUNTER — Telehealth: Payer: Self-pay | Admitting: Cardiology

## 2014-11-23 NOTE — Telephone Encounter (Signed)
Received records from [facility] for appointment on 01/10/15 with Dr [facility].  Records given to D Chavis (medical records) for Dr Ludwig Clarksrenshaw's schedule on 01/10/15. lp

## 2015-01-10 ENCOUNTER — Ambulatory Visit: Payer: Self-pay | Admitting: Cardiology

## 2015-03-07 ENCOUNTER — Ambulatory Visit: Payer: Self-pay | Admitting: Cardiology

## 2015-04-03 NOTE — Progress Notes (Signed)
     HPI: 74 year old male for evaluation of coronary artery disease. Patient is status post coronary artery bypass graft in 2006 with LIMA to the LAD, saphenous vein graft to diagonal, saphenous vein graft to the obtuse marginal and saphenous vein graft to the right coronary artery. Also with history of peripheral vascular disease. Seen in this office previously but not since 2009.   No current outpatient prescriptions on file.   No current facility-administered medications for this visit.    Allergies not on file  Past Medical History  Diagnosis Date  . Peripheral vascular disease, unspecified   . Coronary atherosclerosis of artery bypass graft   . HTN (hypertension)   . Other and unspecified hyperlipidemia   . DM (diabetes mellitus)     Past Surgical History  Procedure Laterality Date  . Coronary artery bypass graft    . Total hip arthroplasty      Social History   Social History  . Marital Status: Married    Spouse Name: N/A  . Number of Children: N/A  . Years of Education: N/A   Occupational History  . Not on file.   Social History Main Topics  . Smoking status: Former Games developermoker  . Smokeless tobacco: Not on file  . Alcohol Use: No  . Drug Use: No  . Sexual Activity: Not on file   Other Topics Concern  . Not on file   Social History Narrative   Retired, married, does not get regular exercise     Family History  Problem Relation Age of Onset  . Cancer      family hx  . Hypertension      family hx    ROS: no fevers or chills, productive cough, hemoptysis, dysphasia, odynophagia, melena, hematochezia, dysuria, hematuria, rash, seizure activity, orthopnea, PND, pedal edema, claudication. Remaining systems are negative.  Physical Exam:   There were no vitals taken for this visit.  General:  Well developed/well nourished in NAD Skin warm/dry Patient not depressed No peripheral clubbing Back-normal HEENT-normal/normal eyelids Neck supple/normal  carotid upstroke bilaterally; no bruits; no JVD; no thyromegaly chest - CTA/ normal expansion CV - RRR/normal S1 and S2; no murmurs, rubs or gallops;  PMI nondisplaced Abdomen -NT/ND, no HSM, no mass, + bowel sounds, no bruit 2+ femoral pulses, no bruits Ext-no edema, chords, 2+ DP Neuro-grossly nonfocal  ECG    This encounter was created in error - please disregard.

## 2015-04-13 ENCOUNTER — Encounter: Payer: Self-pay | Admitting: Cardiology

## 2015-06-20 NOTE — Progress Notes (Signed)
     HPI: 75 year old male for evaluation of coronary artery disease. Patient has previously been seen but not since 2009. He is status post coronary artery bypass graft in 2006 with a LIMA to the LAD, saphenous graft to the diagonal, saphenous vein graft to the acute marginal and saphenous vein graft to the right coronary artery. He also has a history of cerebrovascular disease and peripheral vascular disease.   No current outpatient prescriptions on file.   No current facility-administered medications for this visit.    Allergies not on file  Past Medical History  Diagnosis Date  . Peripheral vascular disease, unspecified   . Coronary atherosclerosis of artery bypass graft   . HTN (hypertension)   . Other and unspecified hyperlipidemia   . DM (diabetes mellitus)     Past Surgical History  Procedure Laterality Date  . Coronary artery bypass graft    . Total hip arthroplasty      Social History   Social History  . Marital Status: Married    Spouse Name: N/A  . Number of Children: N/A  . Years of Education: N/A   Occupational History  . Not on file.   Social History Main Topics  . Smoking status: Former Games developer  . Smokeless tobacco: Not on file  . Alcohol Use: No  . Drug Use: No  . Sexual Activity: Not on file   Other Topics Concern  . Not on file   Social History Narrative   Retired, married, does not get regular exercise     Family History  Problem Relation Age of Onset  . Cancer      family hx  . Hypertension      family hx    ROS: no fevers or chills, productive cough, hemoptysis, dysphasia, odynophagia, melena, hematochezia, dysuria, hematuria, rash, seizure activity, orthopnea, PND, pedal edema, claudication. Remaining systems are negative.  Physical Exam:   There were no vitals taken for this visit.  General:  Well developed/well nourished in NAD Skin warm/dry Patient not depressed No peripheral clubbing Back-normal HEENT-normal/normal  eyelids Neck supple/normal carotid upstroke bilaterally; no bruits; no JVD; no thyromegaly chest - CTA/ normal expansion CV - RRR/normal S1 and S2; no murmurs, rubs or gallops;  PMI nondisplaced Abdomen -NT/ND, no HSM, no mass, + bowel sounds, no bruit 2+ femoral pulses, no bruits Ext-no edema, chords, 2+ DP Neuro-grossly nonfocal  ECG    This encounter was created in error - please disregard.

## 2015-06-25 ENCOUNTER — Encounter: Payer: Self-pay | Admitting: Cardiology

## 2015-06-27 ENCOUNTER — Encounter: Payer: Self-pay | Admitting: *Deleted

## 2015-07-21 ENCOUNTER — Ambulatory Visit (HOSPITAL_COMMUNITY)
Admission: AD | Admit: 2015-07-21 | Discharge: 2015-07-21 | Disposition: A | Discharge status: Home / Self Care | Payer: Medicare Other | Source: Other Acute Inpatient Hospital | Attending: Internal Medicine | Admitting: Internal Medicine

## 2015-07-21 DIAGNOSIS — A419 Sepsis, unspecified organism: Secondary | ICD-10-CM | POA: Insufficient documentation

## 2015-07-26 NOTE — Progress Notes (Signed)
     HPI:  75 year old male for evaluation of coronary artery disease. Patient had a myocardial infarction in March 2006. Catheterization revealed an ejection fraction of 40-45% and severe three-vessel coronary artery disease. He subsequently had coronary artery bypass graft with a LIMA to the LAD, saphenous vein graft to the diagonal, saphenous vein graft to the obtuse marginal and saphenous vein graft to the right coronary artery. He also has a history of cerebrovascular disease and peripheral vascular disease.   No current outpatient prescriptions on file.   No current facility-administered medications for this visit.    Allergies not on file  Past Medical History  Diagnosis Date  . Peripheral vascular disease, unspecified   . Coronary atherosclerosis of artery bypass graft   . HTN (hypertension)   . Other and unspecified hyperlipidemia   . DM (diabetes mellitus)     Past Surgical History  Procedure Laterality Date  . Coronary artery bypass graft    . Total hip arthroplasty      Social History   Social History  . Marital Status: Married    Spouse Name: N/A  . Number of Children: N/A  . Years of Education: N/A   Occupational History  . Not on file.   Social History Main Topics  . Smoking status: Former Games developermoker  . Smokeless tobacco: Not on file  . Alcohol Use: No  . Drug Use: No  . Sexual Activity: Not on file   Other Topics Concern  . Not on file   Social History Narrative   Retired, married, does not get regular exercise     Family History  Problem Relation Age of Onset  . Cancer      family hx  . Hypertension      family hx    ROS: no fevers or chills, productive cough, hemoptysis, dysphasia, odynophagia, melena, hematochezia, dysuria, hematuria, rash, seizure activity, orthopnea, PND, pedal edema, claudication. Remaining systems are negative.  Physical Exam:   There were no vitals taken for this visit.  General:  Well developed/well nourished in  NAD Skin warm/dry Patient not depressed No peripheral clubbing Back-normal HEENT-normal/normal eyelids Neck supple/normal carotid upstroke bilaterally; no bruits; no JVD; no thyromegaly chest - CTA/ normal expansion CV - RRR/normal S1 and S2; no murmurs, rubs or gallops;  PMI nondisplaced Abdomen -NT/ND, no HSM, no mass, + bowel sounds, no bruit 2+ femoral pulses, no bruits Ext-no edema, chords, 2+ DP Neuro-grossly nonfocal  ECG    This encounter was created in error - please disregard.

## 2015-07-31 ENCOUNTER — Encounter: Payer: Medicare Other | Admitting: Cardiology

## 2015-08-11 DEATH — deceased
